# Patient Record
Sex: Female | Born: 1970 | Race: White | Hispanic: No | Marital: Married | Smoking: Never smoker
Health system: Southern US, Community
[De-identification: ages and names within clinical notes are randomized; demographics above are authoritative.]

## PROBLEM LIST (undated history)

## (undated) HISTORY — PX: AUGMENTATION MAMMAPLASTY: SUR837

## (undated) HISTORY — PX: BREAST SURGERY: SHX581

## (undated) HISTORY — PX: BREAST CYST EXCISION: SHX579

---

## 1999-08-19 HISTORY — PX: OTHER SURGICAL HISTORY: SHX169

## 2019-05-17 ENCOUNTER — Telehealth: Payer: Self-pay | Admitting: General Practice

## 2019-05-17 NOTE — Telephone Encounter (Signed)
Called and left vm for patient to call back. Need more information about appointment for 05/19/2019

## 2019-05-18 ENCOUNTER — Telehealth: Payer: Self-pay | Admitting: Family Medicine

## 2019-05-18 NOTE — Telephone Encounter (Signed)

## 2019-05-19 ENCOUNTER — Encounter: Payer: Self-pay | Admitting: Family Medicine

## 2019-05-19 ENCOUNTER — Ambulatory Visit (INDEPENDENT_AMBULATORY_CARE_PROVIDER_SITE_OTHER): Payer: 59 | Admitting: Family Medicine

## 2019-05-19 ENCOUNTER — Other Ambulatory Visit: Payer: Self-pay

## 2019-05-19 VITALS — BP 128/82 | HR 75 | Temp 98.1°F | Ht 66.0 in | Wt 146.8 lb

## 2019-05-19 DIAGNOSIS — E7800 Pure hypercholesterolemia, unspecified: Secondary | ICD-10-CM

## 2019-05-19 DIAGNOSIS — R7301 Impaired fasting glucose: Secondary | ICD-10-CM

## 2019-05-19 DIAGNOSIS — Z2821 Immunization not carried out because of patient refusal: Secondary | ICD-10-CM | POA: Diagnosis not present

## 2019-05-19 DIAGNOSIS — Z7689 Persons encountering health services in other specified circumstances: Secondary | ICD-10-CM

## 2019-05-19 NOTE — Progress Notes (Signed)
Tiffany Oneal is a 48 y.o. female  Chief Complaint  Patient presents with  . New Patient (Initial Visit)    Pt had a well visit with her job at a [employer] and her BP was elevated and her cholesterol was elevated as well    HPI: Tiffany Oneal is a 47 y.o. female here to establish care with our office. Pt has work-sponsored wellness visit with NP at a [facility] and pts states at that visit BP was elevated and POCT lipid panel revealed elevated cholesterol. Results available in Care Everywhere. Total chol - 248 TG - 91 HDL - 78 LDL - 152 POCT glucose - 104 (fasting) BP recorded on 04/03/2019 in Care Everywhere = 126/88  ASCVD risk = 0.9%  Specialists: OB-GYN (Dr. Silas [place]) - last OV 11/2017  Last PAP: 11/2017 Last mammo: 10/2017 - had Rt breast cyst aspiration in 12/2017  Fam h/o hypercholesterolemia - father (on pravastatin)  History reviewed. No pertinent past medical history.  Past Surgical History:  Procedure Laterality Date  . BREAST SURGERY      Social History   Socioeconomic History  . Marital status: Single    Spouse name: Not on file  . Number of children: Not on file  . Years of education: Not on file  . Highest education level: Not on file  Occupational History  . Not on file  Social Needs  . Financial resource strain: Not on file  . Food insecurity    Worry: Not on file    Inability: Not on file  . Transportation needs    Medical: Not on file    Non-medical: Not on file  Tobacco Use  . Smoking status: Never Smoker  . Smokeless tobacco: Never Used  Substance and Sexual Activity  . Alcohol use: Yes  . Drug use: Never  . Sexual activity: Not on file  Lifestyle  . Physical activity    Days per week: Not on file    Minutes per session: Not on file  . Stress: Not on file  Relationships  . Social Herbalist on phone: Not on file    Gets together: Not on file    Attends religious service: Not on file    Active member  of club or organization: Not on file    Attends meetings of clubs or organizations: Not on file    Relationship status: Not on file  . Intimate partner violence    Fear of current or ex partner: Not on file    Emotionally abused: Not on file    Physically abused: Not on file    Forced sexual activity: Not on file  Other Topics Concern  . Not on file  Social History Narrative  . Not on file    Family History  Problem Relation Age of Onset  . Hyperlipidemia Father       There is no immunization history on file for this patient.  Outpatient Encounter Medications as of 05/19/2019  Medication Sig  . phentermine 37.5 MG capsule Take 37.5 mg by mouth every morning. Take 1/2 tablet in the AM, Take 1/2 tablet at 2pm  . UNABLE TO FIND Med Name: testosterone-1 DHEA  Use one pump in the AM  . UNABLE TO FIND Med Name: Amino Acids with Acetyl-L-tyrosine Take 2-3 in the AM  . UNABLE TO FIND Med Name: Amino Acids with L-arginine & Malte Take 2-4 in the PM  . UNABLE TO FIND Med Name:  Vitamin D liquid drops  . UNABLE TO FIND Med Name: Progesterone cream topical apply daily   No facility-administered encounter medications on file as of 05/19/2019.      ROS: Gen: no fever, chills  Skin: no rash, itching ENT: no ear pain, ear drainage, nasal congestion, rhinorrhea, sinus pressure, sore throat Eyes: no blurry vision, double vision Resp: no cough, wheeze,SOB CV: no CP, palpitations, LE edema,  GI: no heartburn, n/v/d/c, abd pain GU: no dysuria, urgency, frequency, hematuria  MSK: no joint pain, myalgias, back pain Neuro: no dizziness, headache, weakness, vertigo Psych: no depression, anxiety, insomnia   No Known Allergies  BP 128/82   Pulse 75   Temp 98.1 F (36.7 C)   Ht 5\' 6"  (1.676 m)   Wt 146 lb 12.8 oz (66.6 kg)   SpO2 100%   BMI 23.69 kg/m   Physical Exam  Constitutional: She is oriented to person, place, and time. She appears well-developed and well-nourished. No  distress.  Neck: Neck supple. No thyromegaly present.  Cardiovascular: Normal rate, regular rhythm and normal heart sounds.  Pulmonary/Chest: Effort normal and breath sounds normal. No respiratory distress.  Musculoskeletal:        General: No edema.  Lymphadenopathy:    She has no cervical adenopathy.  Neurological: She is alert and oriented to person, place, and time.  Psychiatric: She has a normal mood and affect. Her behavior is normal.     A/P:  1. Encounter to establish care with new doctor - UTD on PAP, mammo  2. Hypercholesterolemia - ASCVD risk = 0.9% (low risk) - pt does not regular exercise and admits to a diet that includes "whatever is convenient". Stressed importance of low fat diet, regular CV exercise as ways to improve cholesterol. Pt was hoping to start a cholesterol medication to "just take care of it" but does understand her risk of cardiovascular event is low and she likely can improve cholesterol numbers with lifestyle changes. Pt agrees to try lifestyle modification x 4-49mo and then will RTO for lab appt at that time to recheck FLP.  3. Influenza vaccination declined by patient  4. Elevated fasting glucose - will plan to check A1C in 4-6 mo along with FLP (see above #2) - pt plans to work on improved diet and increased exercise over the next 4-17mo

## 2019-05-25 ENCOUNTER — Telehealth: Payer: Self-pay

## 2019-05-25 DIAGNOSIS — B9689 Other specified bacterial agents as the cause of diseases classified elsewhere: Secondary | ICD-10-CM

## 2019-05-25 NOTE — Telephone Encounter (Signed)
Copied from [place] [id number]. Topic: General - Other >> May 25, 2019 12:24 PM Antonieta Iba C wrote: Reason for CRM: pt is requesting a Rx for bv? pt says that she has a vaginal odor   Pharmacy: [id number] [place], [place] - [address] AT [employer] [phone] (Phone) [phone] (Fax)  CB:

## 2019-05-26 MED ORDER — METRONIDAZOLE 500 MG PO TABS: 500.0000 mg | ORAL_TABLET | Freq: Two times a day (BID) | ORAL | 0 refills | Status: AC

## 2019-05-26 NOTE — Telephone Encounter (Signed)
I called and spoke with pt. She is aware to make an appt if no improvement after the flagyl.

## 2019-05-26 NOTE — Telephone Encounter (Signed)
Will send Rx for flagyl 500mg  BID x 7 days to pharm on file. If no/minimal improvement, pt will need to make appt to be seen and evaluated. Please call pt to make her aware

## 2019-05-26 NOTE — Addendum Note (Signed)
Addended by: Ronnald Nian on: 05/26/2019 07:55 AM   Modules accepted: Orders

## 2019-09-19 ENCOUNTER — Other Ambulatory Visit: Payer: Self-pay

## 2019-09-19 ENCOUNTER — Other Ambulatory Visit (INDEPENDENT_AMBULATORY_CARE_PROVIDER_SITE_OTHER): Payer: 59

## 2019-09-19 DIAGNOSIS — E7800 Pure hypercholesterolemia, unspecified: Secondary | ICD-10-CM | POA: Diagnosis not present

## 2019-09-19 DIAGNOSIS — R7301 Impaired fasting glucose: Secondary | ICD-10-CM | POA: Diagnosis not present

## 2019-09-19 LAB — LIPID PANEL
Cholesterol: 231 mg/dL — ABNORMAL HIGH (ref 0–200)
HDL: 69 mg/dL (ref >39.00)
LDL Cholesterol: 142 mg/dL — ABNORMAL HIGH (ref 0–99)
NonHDL: 162.12
Total CHOL/HDL Ratio: 3
Triglycerides: 102 mg/dL (ref 0.0–149.0)
VLDL: 20.4 mg/dL (ref 0.0–40.0)

## 2019-09-19 LAB — HEMOGLOBIN A1C: Hgb A1c MFr Bld: 5.9 % (ref 4.6–6.5)

## 2019-09-19 NOTE — Addendum Note (Signed)
Addended by: Varney Biles on: 09/19/2019 12:19 PM   Modules accepted: Orders

## 2019-09-19 NOTE — Addendum Note (Signed)
Addended by: Varney Biles on: 09/19/2019 03:23 PM   Modules accepted: Orders

## 2019-10-03 ENCOUNTER — Encounter (HOSPITAL_BASED_OUTPATIENT_CLINIC_OR_DEPARTMENT_OTHER): Payer: Self-pay

## 2019-10-03 ENCOUNTER — Other Ambulatory Visit: Payer: Self-pay

## 2019-10-03 ENCOUNTER — Other Ambulatory Visit (HOSPITAL_BASED_OUTPATIENT_CLINIC_OR_DEPARTMENT_OTHER): Payer: Self-pay | Admitting: Family Medicine

## 2019-10-03 ENCOUNTER — Ambulatory Visit (HOSPITAL_BASED_OUTPATIENT_CLINIC_OR_DEPARTMENT_OTHER)
Admission: RE | Admit: 2019-10-03 | Discharge: 2019-10-03 | Disposition: A | Discharge status: Home / Self Care | Payer: 59 | Source: Ambulatory Visit | Attending: Family Medicine | Admitting: Family Medicine

## 2019-10-03 DIAGNOSIS — Z1231 Encounter for screening mammogram for malignant neoplasm of breast: Secondary | ICD-10-CM | POA: Diagnosis present

## 2019-10-07 ENCOUNTER — Encounter: Payer: 59 | Admitting: Family Medicine

## 2019-10-14 ENCOUNTER — Encounter: Payer: Self-pay | Admitting: Family Medicine

## 2019-10-14 NOTE — Patient Instructions (Addendum)
Health Maintenance Due  Topic Date Due  . HIV Screening  08/15/1985  . TETANUS/TDAP  08/15/1989  . PAP SMEAR-Modifier  08/16/1991    No flowsheet data found.  Health Maintenance, Female Adopting a healthy lifestyle and getting preventive care are important in promoting health and wellness. Ask your health care provider about:  The right schedule for you to have regular tests and exams.  Things you can do on your own to prevent diseases and keep yourself healthy. What should I know about diet, weight, and exercise? Eat a healthy diet   Eat a diet that includes plenty of vegetables, fruits, low-fat dairy products, and lean protein.  Do not eat a lot of foods that are high in solid fats, added sugars, or sodium. Maintain a healthy weight Body mass index (BMI) is used to identify weight problems. It estimates body fat based on height and weight. Your health care provider can help determine your BMI and help you achieve or maintain a healthy weight. Get regular exercise Get regular exercise. This is one of the most important things you can do for your health. Most adults should:  Exercise for at least 150 minutes each week. The exercise should increase your heart rate and make you sweat (moderate-intensity exercise).  Do strengthening exercises at least twice a week. This is in addition to the moderate-intensity exercise.  Spend less time sitting. Even light physical activity can be beneficial. Watch cholesterol and blood lipids Have your blood tested for lipids and cholesterol at 50 years of age, then have this test every 5 years. Have your cholesterol levels checked more often if:  Your lipid or cholesterol levels are high.  You are older than 50 years of age.  You are at high risk for heart disease. What should I know about cancer screening? Depending on your health history and family history, you may need to have cancer screening at various ages. This may include screening  for:  Breast cancer.  Cervical cancer.  Colorectal cancer.  Skin cancer.  Lung cancer. What should I know about heart disease, diabetes, and high blood pressure? Blood pressure and heart disease  High blood pressure causes heart disease and increases the risk of stroke. This is more likely to develop in people who have high blood pressure readings, are of African descent, or are overweight.  Have your blood pressure checked: ? Every 3-5 years if you are 47-49 years of age. ? Every year if you are 34 years old or older. Diabetes Have regular diabetes screenings. This checks your fasting blood sugar level. Have the screening done:  Once every three years after age 8 if you are at a normal weight and have a low risk for diabetes.  More often and at a younger age if you are overweight or have a high risk for diabetes. What should I know about preventing infection? Hepatitis B If you have a higher risk for hepatitis B, you should be screened for this virus. Talk with your health care provider to find out if you are at risk for hepatitis B infection. Hepatitis C Testing is recommended for:  Everyone born from [birth year] through 1966.  Anyone with known risk factors for hepatitis C. Sexually transmitted infections (STIs)  Get screened for STIs, including gonorrhea and chlamydia, if: ? You are sexually active and are younger than 50 years of age. ? You are older than 50 years of age and your health care provider tells you that you are at risk  for this type of infection. ? Your sexual activity has changed since you were last screened, and you are at increased risk for chlamydia or gonorrhea. Ask your health care provider if you are at risk.  Ask your health care provider about whether you are at high risk for HIV. Your health care provider may recommend a prescription medicine to help prevent HIV infection. If you choose to take medicine to prevent HIV, you should first get tested for HIV.  You should then be tested every 3 months for as long as you are taking the medicine. Pregnancy  If you are about to stop having your period (premenopausal) and you may become pregnant, seek counseling before you get pregnant.  Take 400 to 800 micrograms (mcg) of folic acid every day if you become pregnant.  Ask for birth control (contraception) if you want to prevent pregnancy. Osteoporosis and menopause Osteoporosis is a disease in which the bones lose minerals and strength with aging. This can result in bone fractures. If you are 24 years old or older, or if you are at risk for osteoporosis and fractures, ask your health care provider if you should:  Be screened for bone loss.  Take a calcium or vitamin D supplement to lower your risk of fractures.  Be given hormone replacement therapy (HRT) to treat symptoms of menopause. Follow these instructions at home: Lifestyle  Do not use any products that contain nicotine or tobacco, such as cigarettes, e-cigarettes, and chewing tobacco. If you need help quitting, ask your health care provider.  Do not use street drugs.  Do not share needles.  Ask your health care provider for help if you need support or information about quitting drugs. Alcohol use  Do not drink alcohol if: ? Your health care provider tells you not to drink. ? You are pregnant, may be pregnant, or are planning to become pregnant.  If you drink alcohol: ? Limit how much you use to 0-1 drink a day. ? Limit intake if you are breastfeeding.  Be aware of how much alcohol is in your drink. In the [place] one drink equals one 12 oz bottle of beer (355 mL), one 5 oz glass of wine (148 mL), or one 1 oz glass of hard liquor (44 mL). General instructions  Schedule regular health, dental, and eye exams.  Stay current with your vaccines.  Tell your health care provider if: ? You often feel depressed. ? You have ever been abused or do not feel safe at  home. Summary  Adopting a healthy lifestyle and getting preventive care are important in promoting health and wellness.  Follow your health care provider's instructions about healthy diet, exercising, and getting tested or screened for diseases.  Follow your health care provider's instructions on monitoring your cholesterol and blood pressure. This information is not intended to replace advice given to you by your health care provider. Make sure you discuss any questions you have with your health care provider. Document Revised: 07/28/2018 Document Reviewed: 07/28/2018 Elsevier Patient Education  2020 [employer].

## 2019-10-18 ENCOUNTER — Other Ambulatory Visit: Payer: Self-pay

## 2019-10-19 ENCOUNTER — Other Ambulatory Visit (HOSPITAL_COMMUNITY)
Admission: RE | Admit: 2019-10-19 | Discharge: 2019-10-19 | Disposition: A | Discharge status: Home / Self Care | Payer: 59 | Source: Ambulatory Visit | Attending: Family Medicine | Admitting: Family Medicine

## 2019-10-19 ENCOUNTER — Ambulatory Visit (INDEPENDENT_AMBULATORY_CARE_PROVIDER_SITE_OTHER): Payer: 59 | Admitting: Family Medicine

## 2019-10-19 ENCOUNTER — Encounter: Payer: Self-pay | Admitting: Family Medicine

## 2019-10-19 VITALS — BP 122/80 | HR 94 | Temp 98.3°F | Ht 65.5 in | Wt 148.0 lb

## 2019-10-19 DIAGNOSIS — Z0000 Encounter for general adult medical examination without abnormal findings: Secondary | ICD-10-CM | POA: Diagnosis not present

## 2019-10-19 DIAGNOSIS — Z124 Encounter for screening for malignant neoplasm of cervix: Secondary | ICD-10-CM | POA: Insufficient documentation

## 2019-10-19 DIAGNOSIS — R7301 Impaired fasting glucose: Secondary | ICD-10-CM | POA: Diagnosis not present

## 2019-10-19 DIAGNOSIS — E7800 Pure hypercholesterolemia, unspecified: Secondary | ICD-10-CM | POA: Diagnosis not present

## 2019-10-19 NOTE — Progress Notes (Signed)
Tiffany Oneal is a 49 y.o. female  Chief Complaint  Patient presents with  . Annual Exam    Pt here for CPE,  she isn't fasting.  Will receive a Pap today.  Pt due for Tdap but will receive the Covid vaccine first.  Pt UTD on mammogram.    HPI: Tiffany Oneal is a 49 y.o. female here for annual CPE, PAP. She had some labs done 09/2019 which showed prediabetes and elevated cholesterol. She recently joined a gym and is working to change her diet/new recipes (low carb).  She has no concerns/complaints today.   Last PAP: today Last mammo: UTD - 09/2019  Med refills needed today? no  Last menstrual cycle: 10/08/19  History reviewed. No pertinent past medical history.  Past Surgical History:  Procedure Laterality Date  . AUGMENTATION MAMMAPLASTY    . BREAST CYST EXCISION    . BREAST SURGERY    . tubes tied  2001    Social History   Socioeconomic History  . Marital status: Single    Spouse name: Not on file  . Number of children: Not on file  . Years of education: Not on file  . Highest education level: Not on file  Occupational History  . Not on file  Tobacco Use  . Smoking status: Never Smoker  . Smokeless tobacco: Never Used  Substance and Sexual Activity  . Alcohol use: Yes  . Drug use: Never  . Sexual activity: Yes  Other Topics Concern  . Not on file  Social History Narrative  . Not on file   Social Determinants of Health   Financial Resource Strain:   . Difficulty of Paying Living Expenses: Not on file  Food Insecurity:   . Worried About Programme researcher, broadcasting/film/video in the Last Year: Not on file  . Ran Out of Food in the Last Year: Not on file  Transportation Needs:   . Lack of Transportation (Medical): Not on file  . Lack of Transportation (Non-Medical): Not on file  Physical Activity:   . Days of Exercise per Week: Not on file  . Minutes of Exercise per Session: Not on file  Stress:   . Feeling of Stress : Not on file  Social Connections:   .  Frequency of Communication with Friends and Family: Not on file  . Frequency of Social Gatherings with Friends and Family: Not on file  . Attends Religious Services: Not on file  . Active Member of Clubs or Organizations: Not on file  . Attends Banker Meetings: Not on file  . Marital Status: Not on file  Intimate Partner Violence:   . Fear of Current or Ex-Partner: Not on file  . Emotionally Abused: Not on file  . Physically Abused: Not on file  . Sexually Abused: Not on file    Family History  Problem Relation Age of Onset  . Hyperlipidemia Father   . Diabetes Paternal Grandfather       There is no immunization history on file for this patient.  Outpatient Encounter Medications as of 10/19/2019  Medication Sig  . phentermine 37.5 MG capsule Take 37.5 mg by mouth every morning. Take 1/2 tablet in the AM, Take 1/2 tablet at 2pm  . UNABLE TO FIND Med Name: testosterone-1 DHEA  Use one pump in the AM  . UNABLE TO FIND Med Name: Amino Acids with Acetyl-L-tyrosine Take 2-3 in the AM  . UNABLE TO FIND Med Name: Amino Acids with L-arginine &  Malte Take 2-4 in the PM  . UNABLE TO FIND Med Name: Vitamin D liquid drops  . UNABLE TO FIND Med Name: Progesterone cream topical apply daily   No facility-administered encounter medications on file as of 10/19/2019.     ROS: Gen: no fever, chills  Skin: no rash, itching ENT: no ear pain, ear drainage, nasal congestion, rhinorrhea, sinus pressure, sore throat Eyes: no blurry vision, double vision Resp: no cough, wheeze,SOB Breast: no breast tenderness, no nipple discharge, no breast masses CV: no CP, palpitations, LE edema,  GI: no heartburn, n/v/d/c, abd pain GU: no dysuria, urgency, frequency, hematuria; no vaginal itching, odor, discharge MSK: no joint pain, myalgias, back pain Neuro: no dizziness, headache, weakness, vertigo Psych: no depression, anxiety, insomnia   No Known Allergies  BP 122/80 (BP Location: Left  Arm, Patient Position: Sitting, Cuff Size: Normal)   Pulse 94   Temp 98.3 F (36.8 C) (Temporal)   Ht 5' 5.5" (1.664 m)   Wt 148 lb (67.1 kg)   LMP 10/08/2019   SpO2 98%   BMI 24.25 kg/m  Pulse Readings from Last 3 Encounters:  10/19/19 94  05/19/19 75     Physical Exam  Constitutional: She is oriented to person, place, and time. She appears well-developed and well-nourished. No distress.  HENT:  Head: Normocephalic and atraumatic.  Right Ear: Tympanic membrane and ear canal normal.  Left Ear: Tympanic membrane and ear canal normal.  Nose: Nose normal.  Mouth/Throat: Oropharynx is clear and moist and mucous membranes are normal.  Eyes: Pupils are equal, round, and reactive to light. Conjunctivae are normal.  Neck: No thyromegaly present.  Cardiovascular: Normal rate, regular rhythm, normal heart sounds and intact distal pulses.  No murmur heard. Pulmonary/Chest: Effort normal and breath sounds normal. No respiratory distress. She has no wheezes. She has no rhonchi.  Abdominal: Soft. Bowel sounds are normal. She exhibits no distension and no mass. There is no abdominal tenderness.  Genitourinary:    Vagina and uterus normal.  There is no rash, tenderness or lesion on the right labia. There is no rash, tenderness or lesion on the left labia. Cervix exhibits no motion tenderness, no discharge and no friability. Right adnexum displays no mass, no tenderness and no fullness. Left adnexum displays no mass, no tenderness and no fullness.  Musculoskeletal:        General: No edema.     Cervical back: Neck supple.  Lymphadenopathy:    She has no cervical adenopathy.  Neurological: She is alert and oriented to person, place, and time. She exhibits normal muscle tone. Coordination normal.  Skin: Skin is warm and dry.  Psychiatric: She has a normal mood and affect. Her behavior is normal.     A/P:  1. Annual physical exam - PAP today, mammo UTD - plan for colo next year - discussed  importance of regular CV exercise, healthy diet, adequate sleep - dental and vision UTD - ALT; Future - AST; Future - Basic metabolic panel; Future - CBC; Future - Lipid panel; Future - VITAMIN D 25 Hydroxy (Vit-D Deficiency, Fractures); Future - next CPE in 1 year  2. Elevated fasting glucose - Hemoglobin A1c; Future - lab in 6 mo  3. Hypercholesterolemia - Lipid panel; Future - lab in 6 mo  4. Screening for cervical cancer - Cytology - PAP( [place])  This visit occurred during the SARS-CoV-2 public health emergency.  Safety protocols were in place, including screening questions prior to the visit, additional  usage of staff PPE, and extensive cleaning of exam room while observing appropriate contact time as indicated for disinfecting solutions.

## 2019-10-24 LAB — CYTOLOGY - PAP
Adequacy: ABNORMAL
Comment: NEGATIVE
High risk HPV: NEGATIVE

## 2019-10-27 ENCOUNTER — Encounter: Payer: Self-pay | Admitting: Family Medicine

## 2020-03-19 ENCOUNTER — Telehealth: Payer: Self-pay | Admitting: Family Medicine

## 2020-03-19 ENCOUNTER — Other Ambulatory Visit (INDEPENDENT_AMBULATORY_CARE_PROVIDER_SITE_OTHER): Payer: 59

## 2020-03-19 ENCOUNTER — Encounter: Payer: Self-pay | Admitting: Family Medicine

## 2020-03-19 ENCOUNTER — Telehealth: Payer: Self-pay

## 2020-03-19 ENCOUNTER — Other Ambulatory Visit: Payer: Self-pay

## 2020-03-19 DIAGNOSIS — Z0000 Encounter for general adult medical examination without abnormal findings: Secondary | ICD-10-CM | POA: Diagnosis not present

## 2020-03-19 DIAGNOSIS — R7301 Impaired fasting glucose: Secondary | ICD-10-CM

## 2020-03-19 DIAGNOSIS — E7800 Pure hypercholesterolemia, unspecified: Secondary | ICD-10-CM

## 2020-03-19 LAB — HEMOGLOBIN A1C: Hgb A1c MFr Bld: 5.7 % (ref 4.6–6.5)

## 2020-03-19 LAB — BASIC METABOLIC PANEL
BUN: 14 mg/dL (ref 6–23)
CO2: 27 mEq/L (ref 19–32)
Calcium: 9.3 mg/dL (ref 8.4–10.5)
Chloride: 101 mEq/L (ref 96–112)
Creatinine, Ser: 0.95 mg/dL (ref 0.40–1.20)
GFR: 62.37 mL/min (ref >60.00)
Glucose, Bld: 85 mg/dL (ref 70–99)
Potassium: 3.9 mEq/L (ref 3.5–5.1)
Sodium: 134 mEq/L — ABNORMAL LOW (ref 135–145)

## 2020-03-19 LAB — LIPID PANEL
Cholesterol: 196 mg/dL (ref 0–200)
HDL: 72.6 mg/dL (ref >39.00)
LDL Cholesterol: 101 mg/dL — ABNORMAL HIGH (ref 0–99)
NonHDL: 123.47
Total CHOL/HDL Ratio: 3
Triglycerides: 114 mg/dL (ref 0.0–149.0)
VLDL: 22.8 mg/dL (ref 0.0–40.0)

## 2020-03-19 LAB — ALT: ALT: 12 U/L (ref 0–35)

## 2020-03-19 LAB — CBC
HCT: 38.1 % (ref 36.0–46.0)
Hemoglobin: 12.7 g/dL (ref 12.0–15.0)
MCHC: 33.4 g/dL (ref 30.0–36.0)
MCV: 88.8 fl (ref 78.0–100.0)
Platelets: 202 10*3/uL (ref 150.0–400.0)
RBC: 4.3 Mil/uL (ref 3.87–5.11)
RDW: 13.6 % (ref 11.5–15.5)
WBC: 4.6 10*3/uL (ref 4.0–10.5)

## 2020-03-19 LAB — VITAMIN D 25 HYDROXY (VIT D DEFICIENCY, FRACTURES): VITD: 119.99 ng/mL (ref 30.00–100.00)

## 2020-03-19 LAB — AST: AST: 15 U/L (ref 0–37)

## 2020-03-19 NOTE — Telephone Encounter (Signed)
I spoke with Marylene Land to inquire about pt's lab work.  Marylene Land informed me that she had drew an extra 2 tubes of blood from pt and froze it just in case Dr. Salena Saner decide to have lab that pt will fax drawn.

## 2020-03-19 NOTE — Telephone Encounter (Signed)
Pt has a form with additional labs she would like done, she said she is going to fax them in to [phone]

## 2020-03-19 NOTE — Telephone Encounter (Signed)
Called patient with critical Vitamin D levels (120). Patient verbally understood Dr. Evangeline Gula recommendations to stop taking her vitamin D supplements due to high levels. Patient agrees to stop supplements no concerns at this time.

## 2020-03-21 NOTE — Telephone Encounter (Signed)
Responded to pt via mychart. The labs requested are not ones I'm comfortable ordering for a provider outside the system.

## 2020-03-22 ENCOUNTER — Other Ambulatory Visit: Payer: Self-pay

## 2020-03-22 ENCOUNTER — Encounter: Payer: Self-pay | Admitting: Family Medicine

## 2020-03-23 ENCOUNTER — Other Ambulatory Visit (HOSPITAL_COMMUNITY)
Admission: RE | Admit: 2020-03-23 | Discharge: 2020-03-23 | Disposition: A | Discharge status: Home / Self Care | Payer: 59 | Source: Ambulatory Visit | Attending: Family Medicine | Admitting: Family Medicine

## 2020-03-23 ENCOUNTER — Encounter: Payer: Self-pay | Admitting: Family Medicine

## 2020-03-23 ENCOUNTER — Ambulatory Visit (INDEPENDENT_AMBULATORY_CARE_PROVIDER_SITE_OTHER): Payer: 59 | Admitting: Family Medicine

## 2020-03-23 VITALS — BP 120/80 | HR 81 | Temp 97.9°F | Ht 65.5 in | Wt 148.4 lb

## 2020-03-23 DIAGNOSIS — Z0289 Encounter for other administrative examinations: Secondary | ICD-10-CM

## 2020-03-23 DIAGNOSIS — Z124 Encounter for screening for malignant neoplasm of cervix: Secondary | ICD-10-CM | POA: Insufficient documentation

## 2020-03-23 DIAGNOSIS — Z23 Encounter for immunization: Secondary | ICD-10-CM

## 2020-03-23 MED ORDER — TETANUS-DIPHTH-ACELL PERTUSSIS 5-2.5-18.5 LF-MCG/0.5 IM SUSP
0.5000 mL | Freq: Once | INTRAMUSCULAR | Status: AC
  Administered 2020-03-23: 0.5 mL via INTRAMUSCULAR

## 2020-03-23 NOTE — Progress Notes (Signed)
Tiffany Oneal is a 49 y.o. female  Chief Complaint  Patient presents with  . Gynecologic Exam    Pt here for a pap smear and a Tdap.    HPI: Tiffany Oneal is a 49 y.o. female here for repeat PAP, as her PAP done in 10/2019 was non-diagnostic.  No h/o abnormal PAP.  Denies vaginal itching, odor, discharge. Denies irregular/abnormal bleeding.   Pt is also due for Tdap vaccine and will get today.  History reviewed. No pertinent past medical history.  Past Surgical History:  Procedure Laterality Date  . AUGMENTATION MAMMAPLASTY    . BREAST CYST EXCISION    . BREAST SURGERY    . tubes tied  2001    Social History   Socioeconomic History  . Marital status: Single    Spouse name: Not on file  . Number of children: Not on file  . Years of education: Not on file  . Highest education level: Not on file  Occupational History  . Not on file  Tobacco Use  . Smoking status: Never Smoker  . Smokeless tobacco: Never Used  Substance and Sexual Activity  . Alcohol use: Yes  . Drug use: Never  . Sexual activity: Yes  Other Topics Concern  . Not on file  Social History Narrative  . Not on file   Social Determinants of Health   Financial Resource Strain:   . Difficulty of Paying Living Expenses:   Food Insecurity:   . Worried About Programme researcher, broadcasting/film/video in the Last Year:   . Barista in the Last Year:   Transportation Needs:   . Freight forwarder (Medical):   Marland Kitchen Lack of Transportation (Non-Medical):   Physical Activity:   . Days of Exercise per Week:   . Minutes of Exercise per Session:   Stress:   . Feeling of Stress :   Social Connections:   . Frequency of Communication with Friends and Family:   . Frequency of Social Gatherings with Friends and Family:   . Attends Religious Services:   . Active Member of Clubs or Organizations:   . Attends Banker Meetings:   Marland Kitchen Marital Status:   Intimate Partner Violence:   . Fear of Current or  Ex-Partner:   . Emotionally Abused:   Marland Kitchen Physically Abused:   . Sexually Abused:     Family History  Problem Relation Age of Onset  . Hyperlipidemia Father   . Diabetes Paternal Grandfather      Immunization History  Administered Date(s) Administered  . Moderna SARS-COVID-2 Vaccination 10/26/2019    Outpatient Encounter Medications as of 03/23/2020  Medication Sig  . phentermine 37.5 MG capsule Take 37.5 mg by mouth every morning. Take 1/2 tablet in the AM, Take 1/2 tablet at 2pm  . UNABLE TO FIND Med Name: testosterone-1 DHEA  Use one pump in the AM  . UNABLE TO FIND Med Name: Amino Acids with Acetyl-L-tyrosine Take 2-3 in the AM  . UNABLE TO FIND Med Name: Amino Acids with L-arginine & Malte Take 2-4 in the PM  . UNABLE TO FIND Med Name: Vitamin D liquid drops  . UNABLE TO FIND Med Name: Progesterone cream topical apply daily   No facility-administered encounter medications on file as of 03/23/2020.     ROS: Pertinent positives and negatives noted in HPI. Remainder of ROS non-contributory   No Known Allergies  BP 120/80 (BP Location: Left Arm, Patient Position: Sitting, Cuff Size: Normal)  Pulse 81   Temp 97.9 F (36.6 C) (Temporal)   Ht 5' 5.5" (1.664 m)   Wt 148 lb 6.4 oz (67.3 kg)   SpO2 98%   BMI 24.32 kg/m   Physical Exam Exam conducted with a chaperone present.  Constitutional:      General: She is not in acute distress.    Appearance: Normal appearance. She is not ill-appearing.  Genitourinary:    Labia:        Right: No rash, tenderness or lesion.        Left: No rash, tenderness or lesion.      Vagina: No vaginal discharge, erythema, tenderness, bleeding or lesions.     Cervix: No cervical motion tenderness, discharge, friability, erythema or cervical bleeding.  Neurological:     Mental Status: She is alert and oriented to person, place, and time.  Psychiatric:        Mood and Affect: Mood normal.        Behavior: Behavior normal.      A/P:    1. Screening for cervical cancer - repeat from 10/2019 which was non-diagnostic  - Cytology - PAP( [facility])  2. Encounter for completion of form with patient - employer health form completed and returned to patient today   This visit occurred during the SARS-CoV-2 public health emergency.  Safety protocols were in place, including screening questions prior to the visit, additional usage of staff PPE, and extensive cleaning of exam room while observing appropriate contact time as indicated for disinfecting solutions.

## 2020-03-23 NOTE — Patient Instructions (Signed)
Health Maintenance Due  Topic Date Due   Hepatitis C Screening  Never done   HIV Screening  Never done   TETANUS/TDAP Pt will receive today. Never done   COVID-19 Vaccine (2 - Moderna 2-dose series) 11/23/2019   INFLUENZA VACCINE  03/18/2020    Depression screen PHQ 2/9 10/19/2019  Decreased Interest 0  Down, Depressed, Hopeless 0  PHQ - 2 Score 0

## 2020-03-27 LAB — CYTOLOGY - PAP
Adequacy: ABSENT
Comment: NEGATIVE
Diagnosis: NEGATIVE
High risk HPV: NEGATIVE

## 2021-06-29 IMAGING — MG DIGITAL SCREENING BREAST BILAT IMPLANT W/ TOMO W/ CAD
8 of 12 series · 8 of 28 positions shown · non-contrast
Comparison: Previous exam(s).

CLINICAL DATA: Screening.

EXAM:
DIGITAL SCREENING BILATERAL MAMMOGRAM WITH IMPLANTS, CAD AND TOMO
The patient has retropectoral implants. Standard and implant
displaced views were performed.

[R CC]
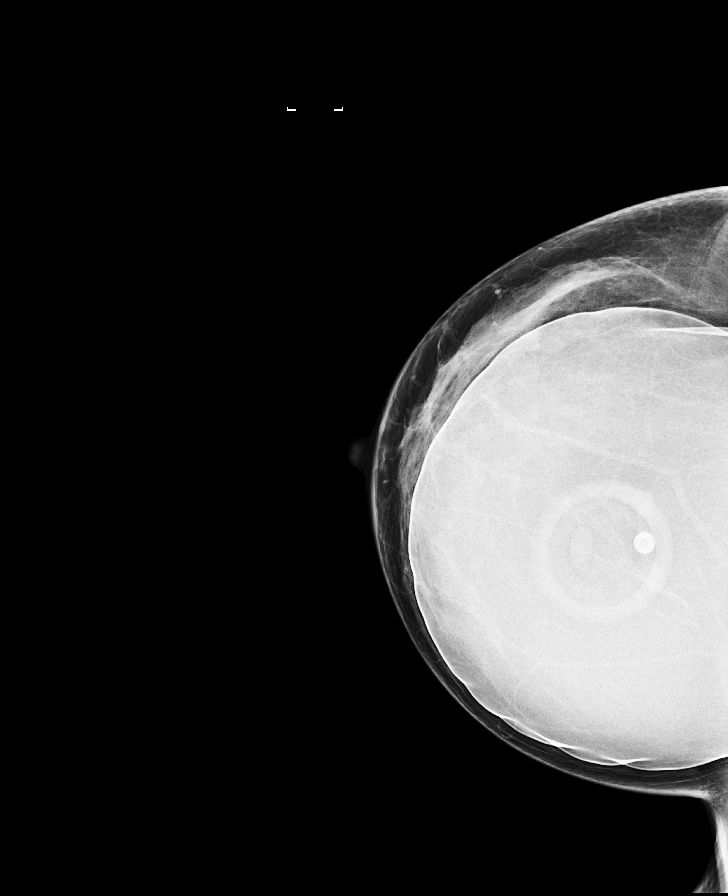

[L MLO]
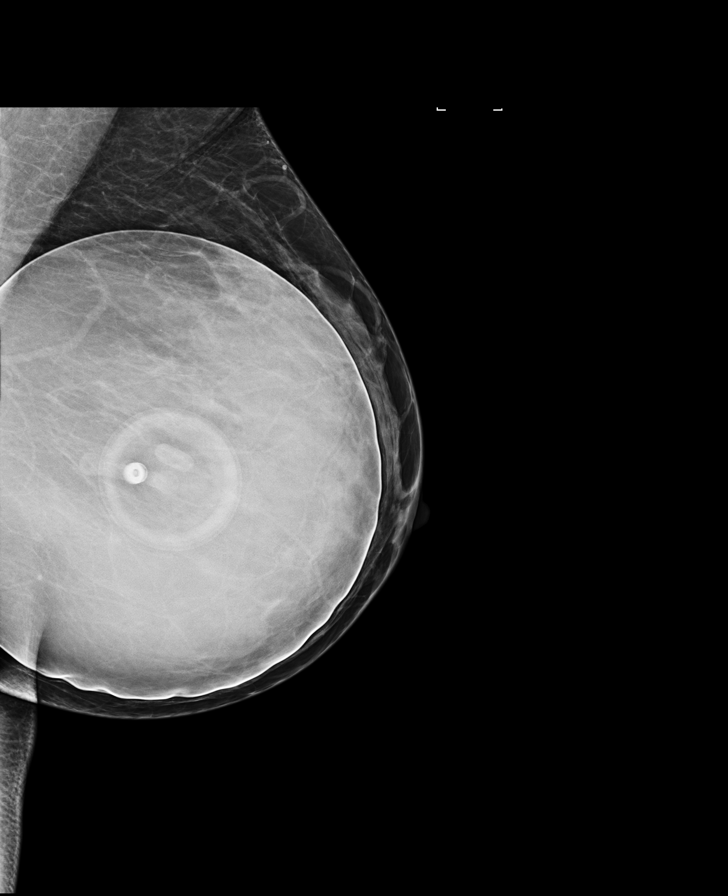

[R MLO]
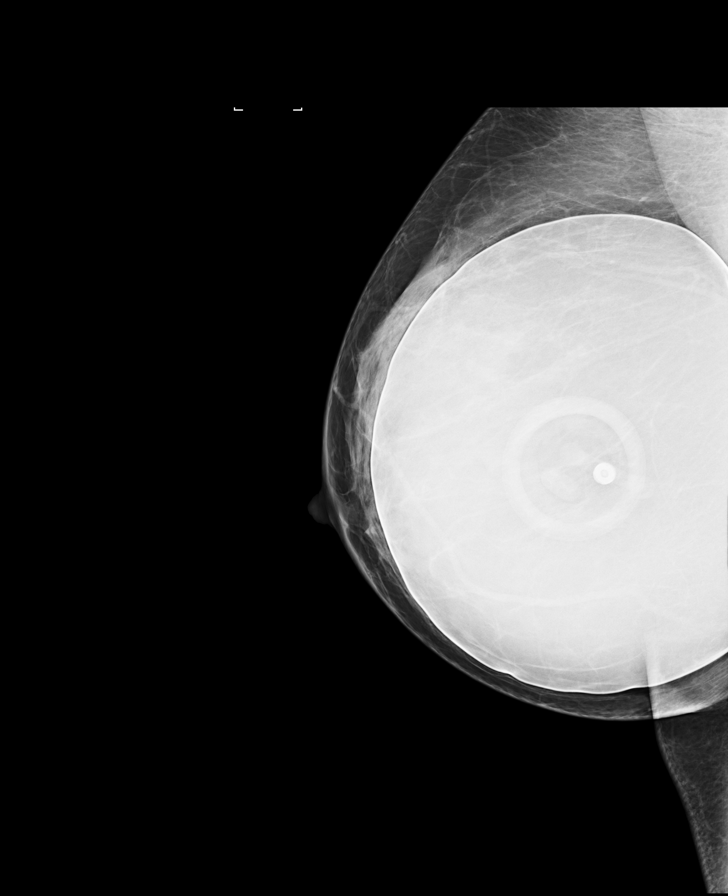

[L CC]
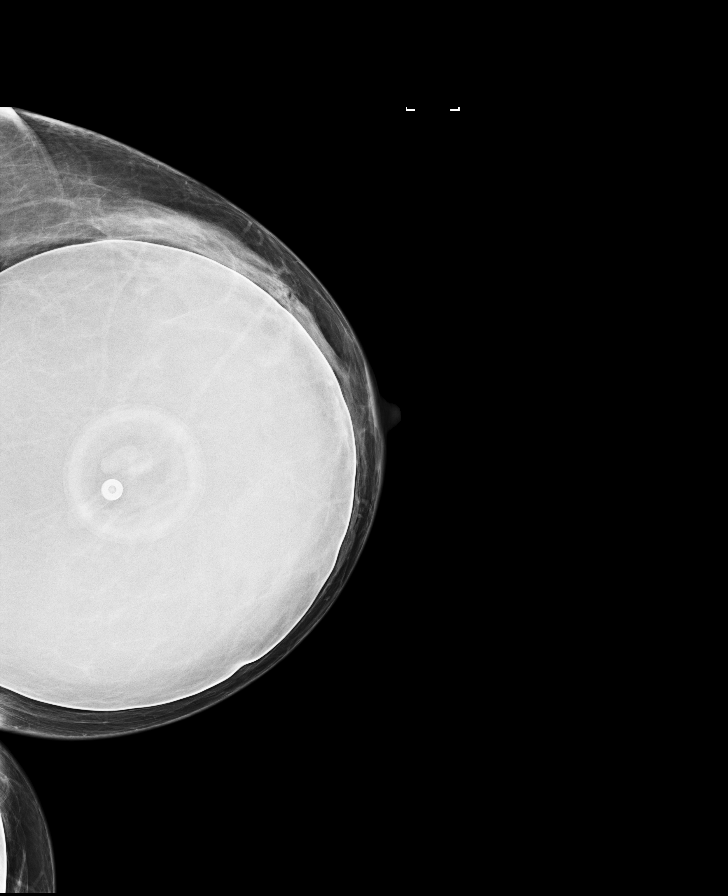

[R CC synth-2D]
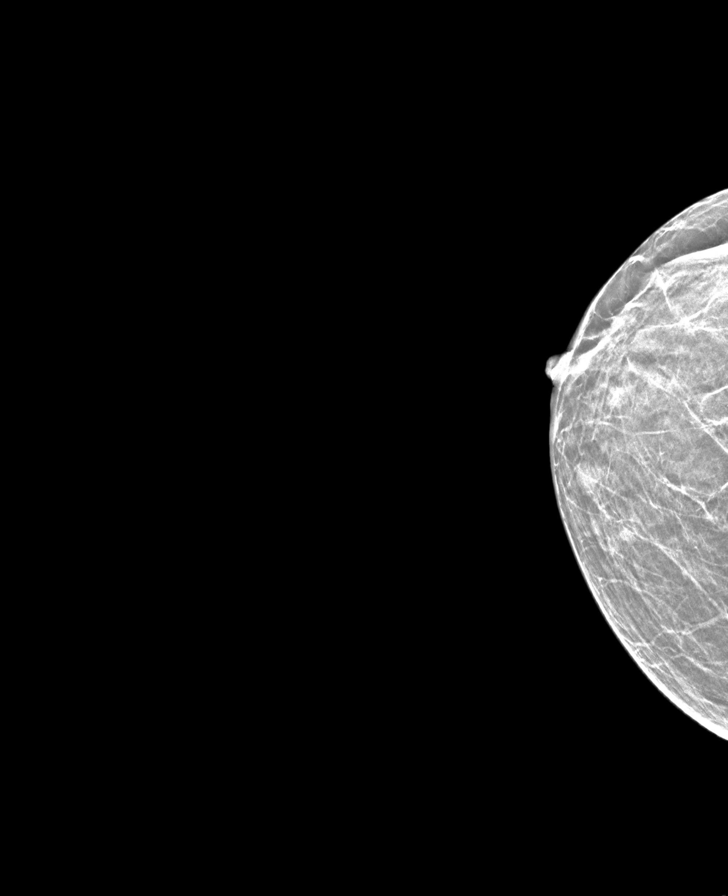

[L MLO synth-2D]
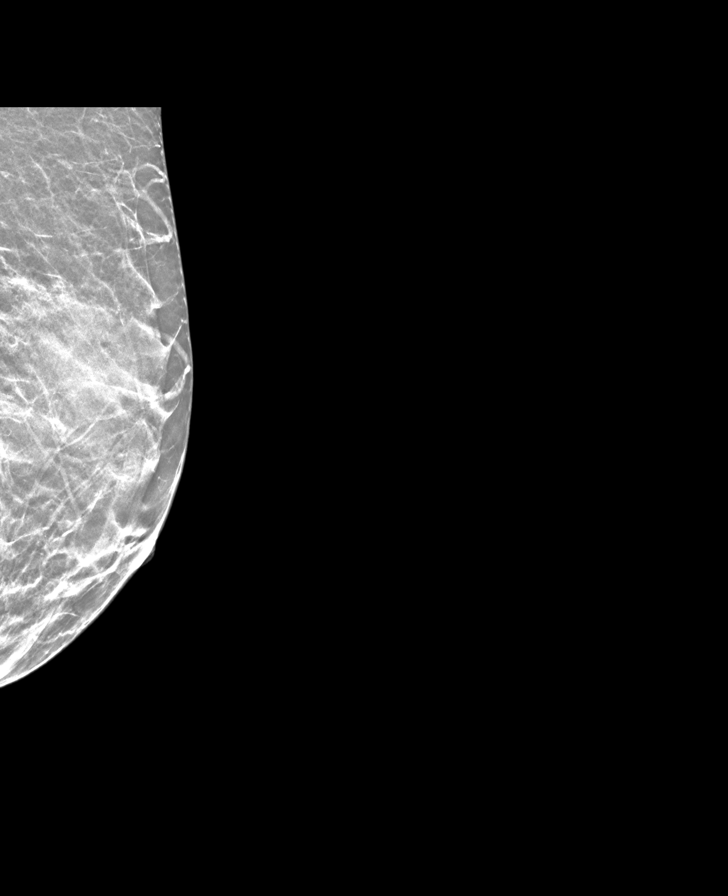

[R MLO synth-2D]
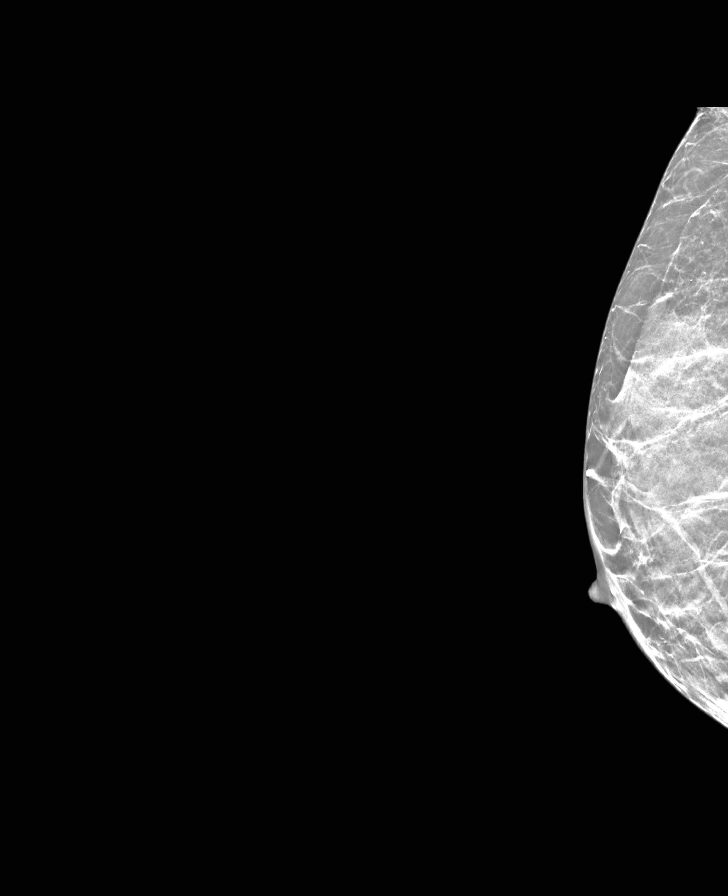

[L CC synth-2D]
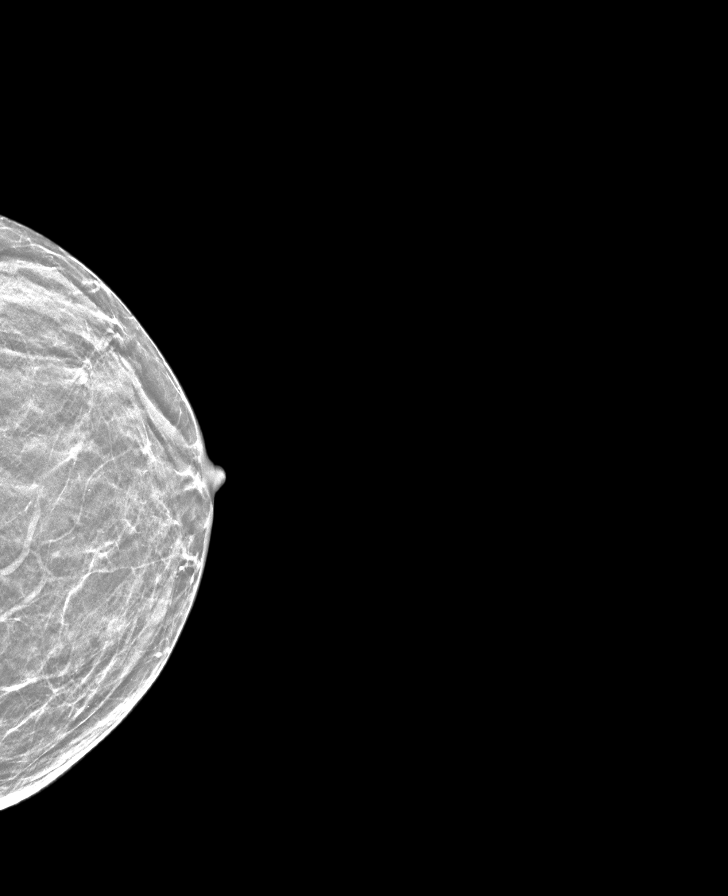

[8 of 28 positions shown; findings below may reference images not displayed]

ACR Breast Density Category c: The breast tissue is heterogeneously
dense, which may obscure small masses.
FINDINGS: There are no findings suspicious for malignancy. Images were
processed with CAD.
IMPRESSION: No mammographic evidence of malignancy. A result letter of this
screening mammogram will be mailed directly to the patient.

RECOMMENDATION:
Screening mammogram in one year. (Code:49-X-OQ9)

BI-RADS CATEGORY  1:  Negative.
